# Patient Record
Sex: Female | Born: 1945 | Race: White | Hispanic: No | Marital: Married | Smoking: Former smoker
Health system: Southern US, Community
[De-identification: ages and names within clinical notes are randomized; demographics above are authoritative.]

## PROBLEM LIST (undated history)

## (undated) DIAGNOSIS — J45909 Unspecified asthma, uncomplicated: Secondary | ICD-10-CM

## (undated) DIAGNOSIS — I499 Cardiac arrhythmia, unspecified: Secondary | ICD-10-CM

## (undated) DIAGNOSIS — J189 Pneumonia, unspecified organism: Secondary | ICD-10-CM

## (undated) DIAGNOSIS — J439 Emphysema, unspecified: Secondary | ICD-10-CM

## (undated) HISTORY — PX: TUBAL LIGATION: SHX77

---

## 2004-01-22 ENCOUNTER — Emergency Department (HOSPITAL_COMMUNITY): Admission: EM | Admit: 2004-01-22 | Discharge: 2004-01-22 | Payer: Self-pay

## 2004-05-11 ENCOUNTER — Emergency Department (HOSPITAL_COMMUNITY): Admission: EM | Admit: 2004-05-11 | Discharge: 2004-05-12 | Payer: Self-pay | Admitting: Emergency Medicine

## 2004-05-12 ENCOUNTER — Emergency Department (HOSPITAL_COMMUNITY): Admission: EM | Admit: 2004-05-12 | Discharge: 2004-05-13 | Payer: Self-pay | Admitting: Emergency Medicine

## 2004-05-12 ENCOUNTER — Emergency Department (HOSPITAL_COMMUNITY): Admission: EM | Admit: 2004-05-12 | Discharge: 2004-05-12 | Payer: Self-pay | Admitting: Emergency Medicine

## 2004-06-21 ENCOUNTER — Emergency Department (HOSPITAL_COMMUNITY): Admission: EM | Admit: 2004-06-21 | Discharge: 2004-06-22 | Payer: Self-pay | Admitting: Emergency Medicine

## 2004-06-22 ENCOUNTER — Emergency Department (HOSPITAL_COMMUNITY): Admission: EM | Admit: 2004-06-22 | Discharge: 2004-06-23 | Payer: Self-pay | Admitting: Emergency Medicine

## 2004-08-13 ENCOUNTER — Emergency Department (HOSPITAL_COMMUNITY): Admission: EM | Admit: 2004-08-13 | Discharge: 2004-08-14 | Payer: Self-pay | Admitting: Emergency Medicine

## 2004-08-18 ENCOUNTER — Inpatient Hospital Stay (HOSPITAL_COMMUNITY): Admission: EM | Admit: 2004-08-18 | Discharge: 2004-08-21 | Payer: Self-pay

## 2004-11-07 ENCOUNTER — Emergency Department (HOSPITAL_COMMUNITY): Admission: EM | Admit: 2004-11-07 | Discharge: 2004-11-07 | Payer: Self-pay | Admitting: Emergency Medicine

## 2004-11-24 ENCOUNTER — Emergency Department (HOSPITAL_COMMUNITY): Admission: EM | Admit: 2004-11-24 | Discharge: 2004-11-24 | Payer: Self-pay | Admitting: Emergency Medicine

## 2004-12-04 ENCOUNTER — Emergency Department (HOSPITAL_COMMUNITY): Admission: EM | Admit: 2004-12-04 | Discharge: 2004-12-04 | Payer: Self-pay | Admitting: Emergency Medicine

## 2004-12-06 ENCOUNTER — Emergency Department: Payer: Self-pay | Admitting: Emergency Medicine

## 2005-01-05 ENCOUNTER — Emergency Department (HOSPITAL_COMMUNITY): Admission: EM | Admit: 2005-01-05 | Discharge: 2005-01-05 | Payer: Self-pay | Admitting: Emergency Medicine

## 2005-04-11 IMAGING — CR DG CHEST 2V
2 series · 2 of 2 positions shown · non-contrast
Comparison: none

CLINICAL DATA: 58-year-old, shortness of breath and chest pain.
 2-VIEW CHEST:
 Two views of the chest compared to prior films from 11/07/04.
 The cardiac silhouette, mediastinal and hilar contours are within normal limits and stable.  There are stable changes of COPD with biapical pleural and parenchymal scarring changes.  Bony structures are intact.

[w chest pa]
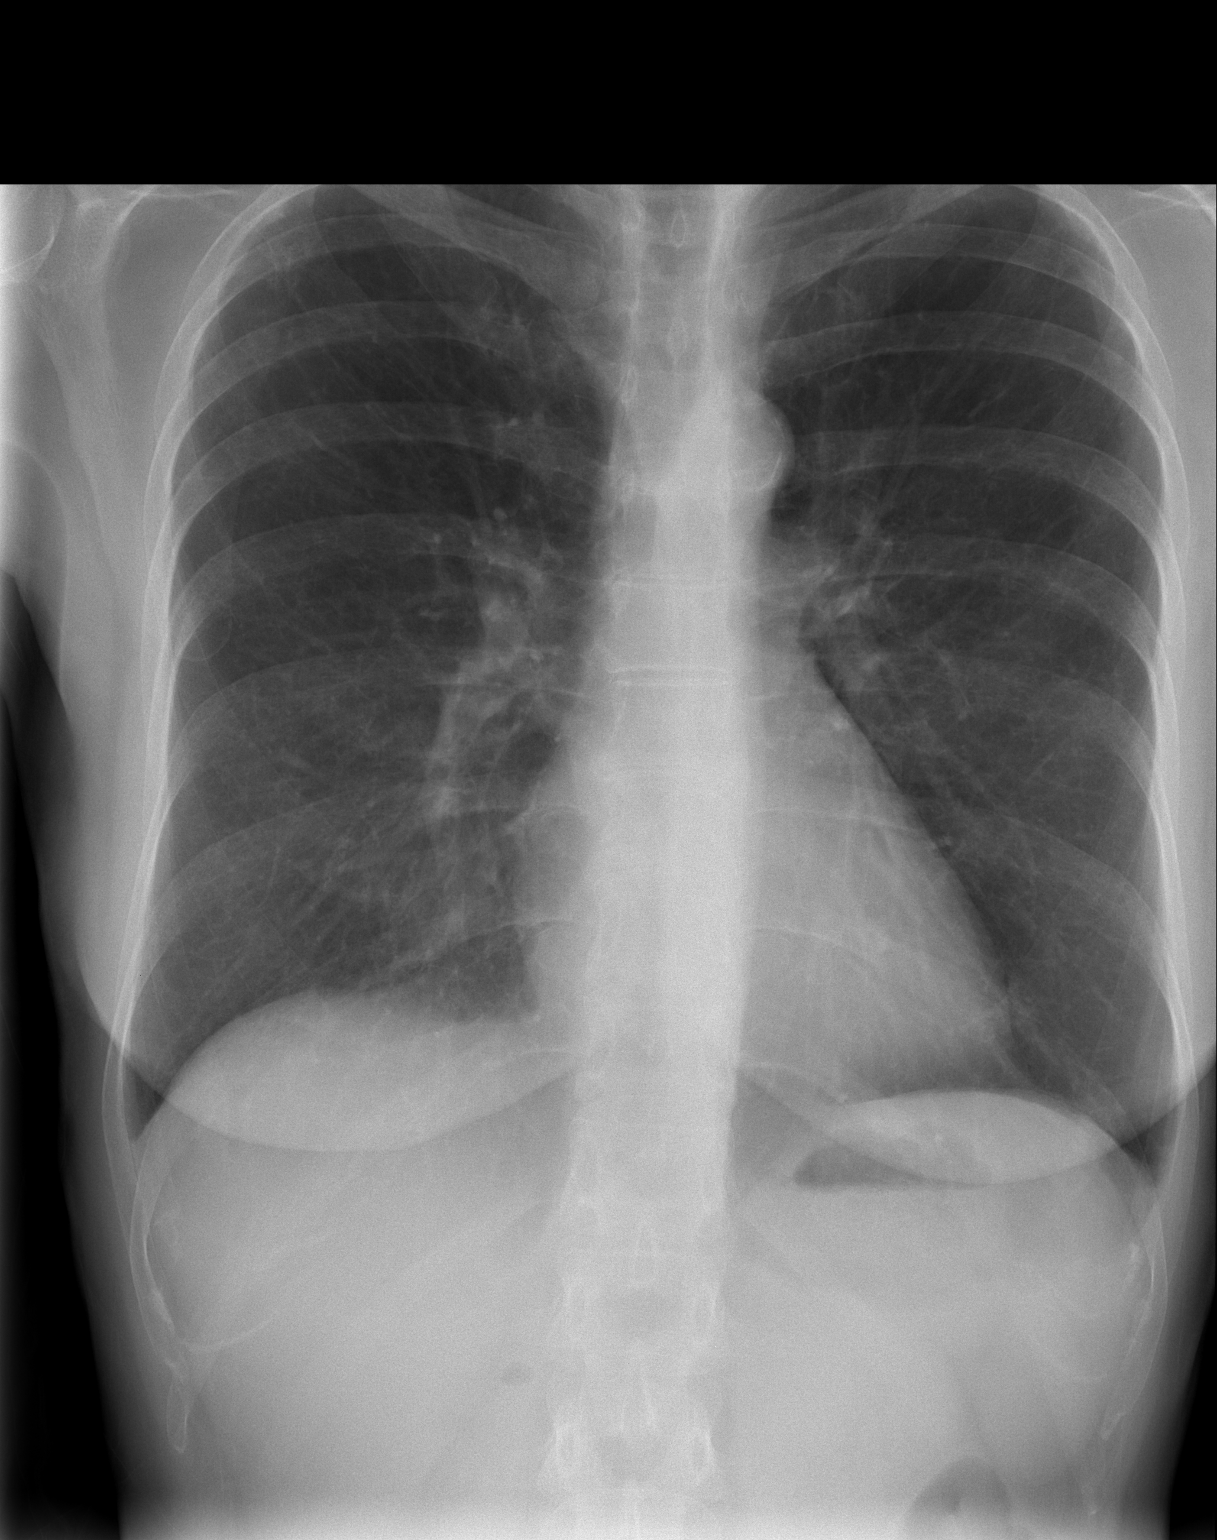

[w chest lat]
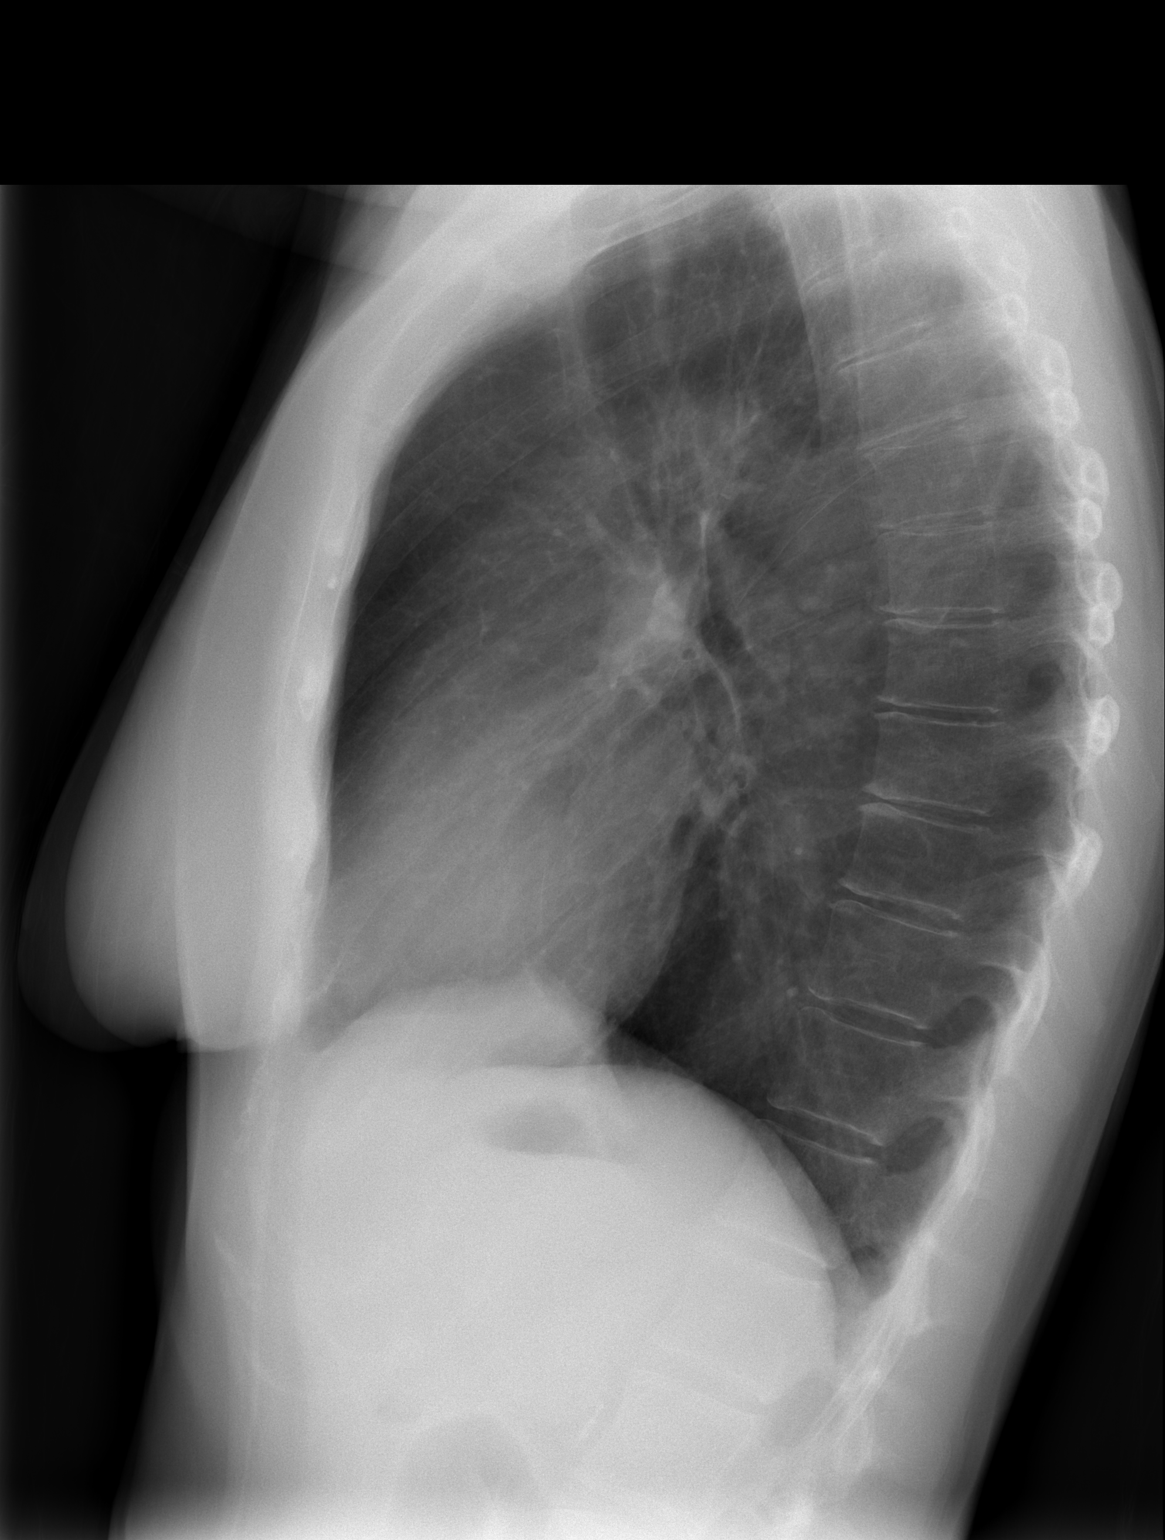

[2 of 2 positions shown; findings below may reference images not displayed]

IMPRESSION: No acute cardiopulmonary findings.  Stable changes of COPD.

## 2005-04-23 IMAGING — CR DG CHEST 2V
1 series · 2 of 2 positions shown · non-contrast
Comparison: none

REASON FOR EXAM: cough
COMMENTS:

[Series 1: view not recorded · 0.17mm/px · 2 of 2 slices shown]
[im 1/2]
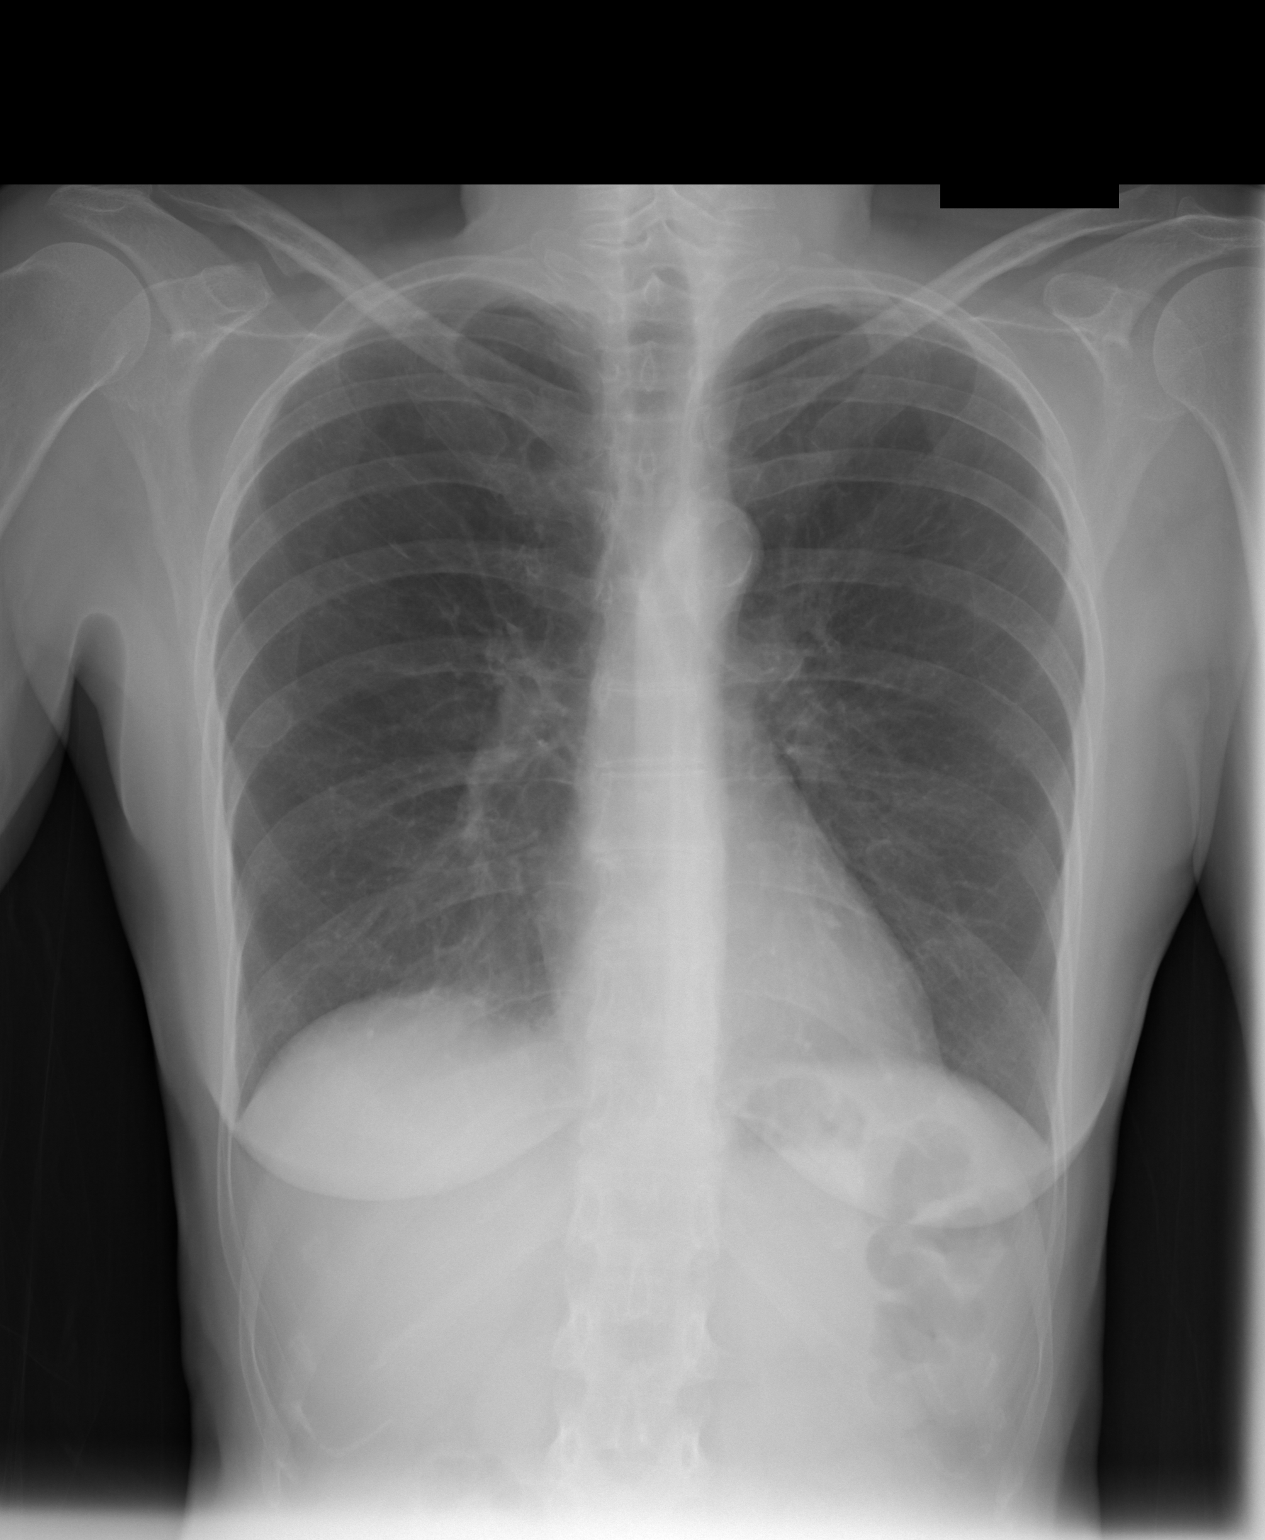
[im 2/2]
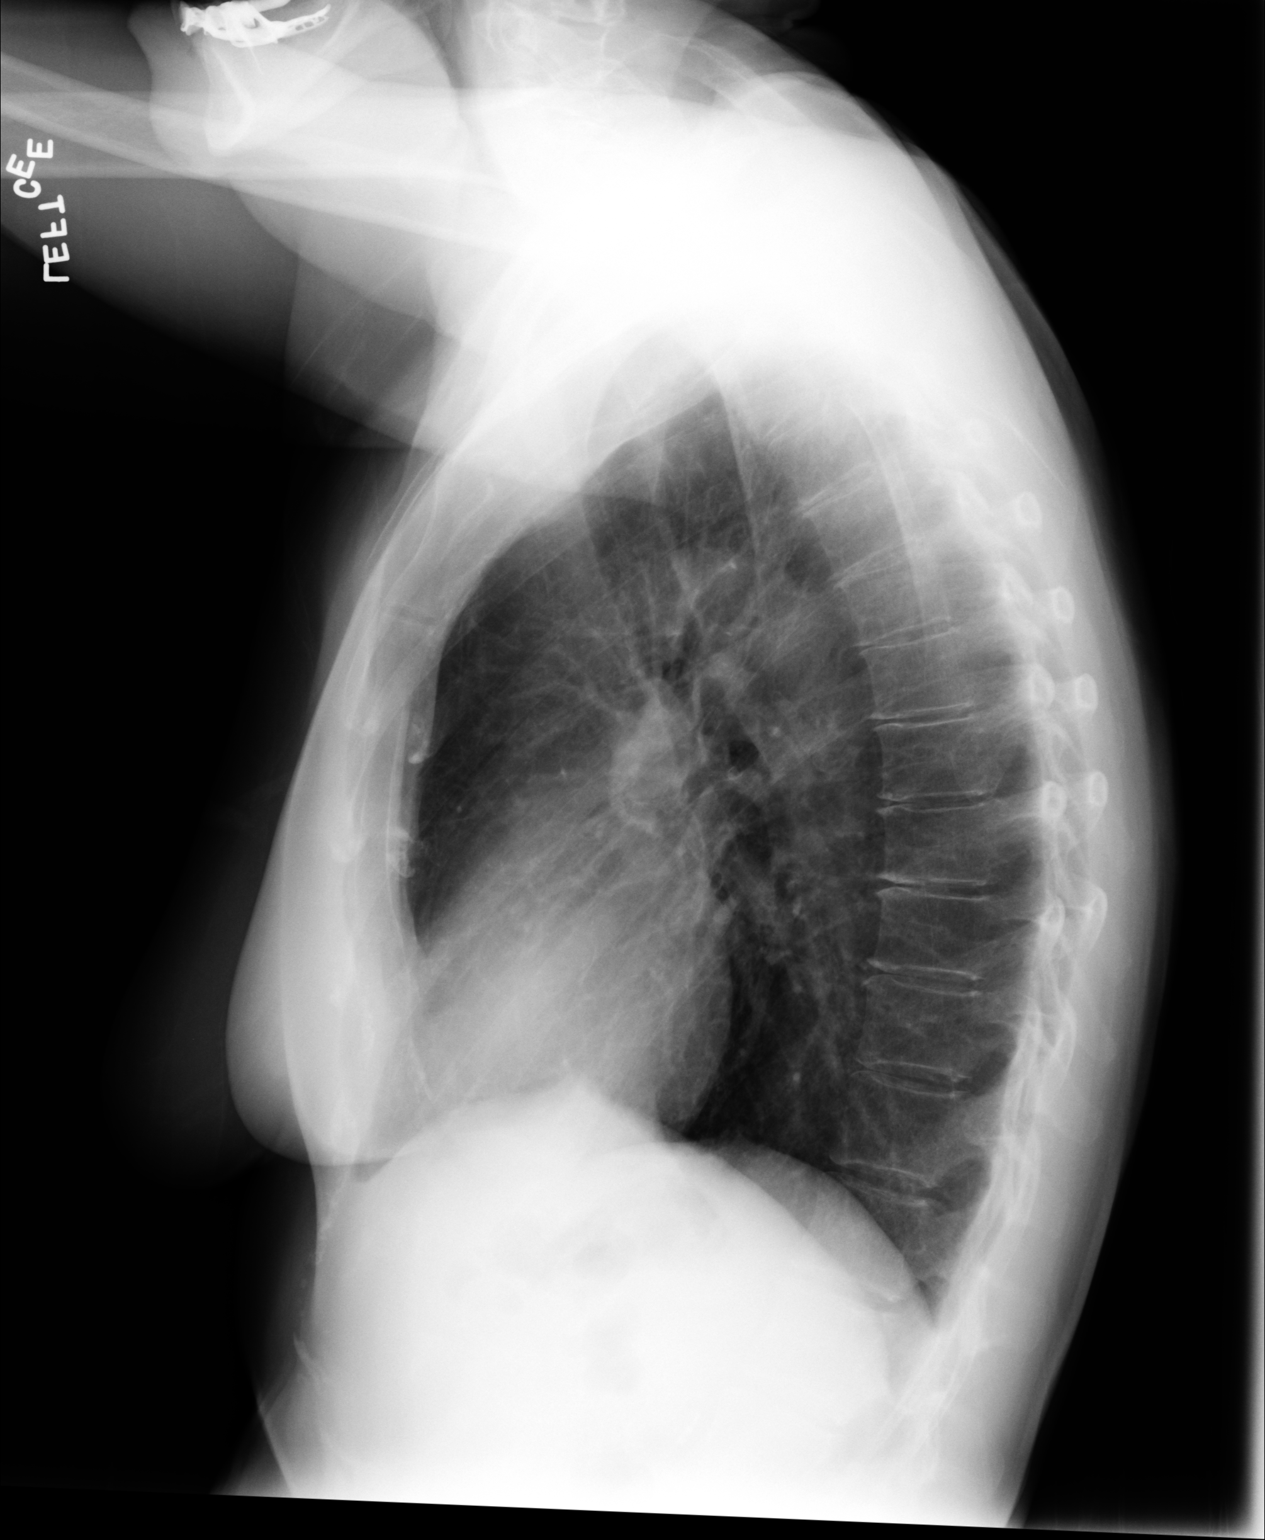

[2 of 2 positions shown; findings below may reference images not displayed]

PROCEDURE:     DXR - DXR CHEST PA (OR AP) AND LATERAL  - December 06, 2004  [DATE]

RESULT:        The lung fields are clear of infiltrate.  There is a linear
metallic density projected medially over the RIGHT lung field. This is not
seen in the lateral view and is apparently external.  No pneumonia,
pneumothorax or pleural effusion is seen.   The heart size is normal.   No
acute bony abnormalities are noted.
IMPRESSION: No acute changes are identified.

## 2017-09-13 ENCOUNTER — Encounter (HOSPITAL_COMMUNITY): Payer: Self-pay | Admitting: *Deleted

## 2017-09-13 ENCOUNTER — Emergency Department (HOSPITAL_COMMUNITY)
Admission: EM | Admit: 2017-09-13 | Discharge: 2017-09-13 | Disposition: A | Discharge status: Home / Self Care | Payer: Medicare Other | Attending: Emergency Medicine | Admitting: Emergency Medicine

## 2017-09-13 ENCOUNTER — Other Ambulatory Visit: Payer: Self-pay

## 2017-09-13 DIAGNOSIS — J449 Chronic obstructive pulmonary disease, unspecified: Secondary | ICD-10-CM | POA: Insufficient documentation

## 2017-09-13 DIAGNOSIS — Z87891 Personal history of nicotine dependence: Secondary | ICD-10-CM | POA: Insufficient documentation

## 2017-09-13 DIAGNOSIS — G894 Chronic pain syndrome: Secondary | ICD-10-CM | POA: Diagnosis present

## 2017-09-13 HISTORY — DX: Pneumonia, unspecified organism: J18.9

## 2017-09-13 HISTORY — DX: Cardiac arrhythmia, unspecified: I49.9

## 2017-09-13 HISTORY — DX: Emphysema, unspecified: J43.9

## 2017-09-13 HISTORY — DX: Unspecified asthma, uncomplicated: J45.909

## 2017-09-13 MED ORDER — HYDROCODONE-ACETAMINOPHEN 5-325 MG PO TABS
2.0000 | ORAL_TABLET | Freq: Once | ORAL | Status: AC
  Administered 2017-09-13: 2 via ORAL
  Filled 2017-09-13: qty 2

## 2017-09-13 NOTE — ED Provider Notes (Signed)
[facility] EMERGENCY DEPARTMENT Provider Note   CSN: [id number] Arrival date & time: 09/13/17  0117     History   Chief Complaint Chief Complaint  Patient presents with  . Medication Refill    HPI Sydney Armstrong is a 72 y.o. female.  The history is provided by the patient.  She has history of emphysema, pneumonia, and is treated for chronic pain.  She is visiting here from [place] and has all of her medication here except for her hydrocodone-acetaminophen.  She states that when she gets choked with phlegm, she takes her hydrocodone-acetaminophen and it helps her raise the sputum.  She states she has had 2 bouts of pneumonia this year.  She is complaining of generalized pain and thick sputum that gets caught in her throat and she does not have her hydrocodone-acetaminophen here with her.  She is asking for a refill on that prescription.  She denies fever or chills.  She denies vomiting or diarrhea.  Past Medical History:  Diagnosis Date  . Asthma   . Emphysema lung (HCC)   . Irregular heart beat   . Pneumonia     There are no active problems to display for this patient.   Past Surgical History:  Procedure Laterality Date  . TUBAL LIGATION      OB History    No data available       Home Medications    Prior to Admission medications   Not on File    Family History No family history on file.  Social History Social History   Tobacco Use  . Smoking status: Former Games developer  . Smokeless tobacco: Never Used  Substance Use Topics  . Alcohol use: No    Frequency: Never  . Drug use: No     Allergies   Atenolol; Clindamycin/lincomycin; Compazine [prochlorperazine edisylate]; Keflex [cephalexin]; Penicillins; Phenergan [promethazine hcl]; Prednisone; and Sulfa antibiotics   Review of Systems Review of Systems  All other systems reviewed and are negative.    Physical Exam Updated Vital Signs BP 136/87 (BP Location: Right Arm)    Pulse 80   Temp 98.4 F (36.9 C) (Oral)   Resp 15   SpO2 100%   Physical Exam  Nursing note and vitals reviewed.  72 year old female, resting comfortably and in no acute distress. Vital signs are normal. Oxygen saturation is 100%, which is normal. Head is normocephalic and atraumatic. PERRLA, EOMI. Oropharynx is clear. Neck is nontender and supple without adenopathy or JVD. Back is nontender and there is no CVA tenderness. Lungs are clear without rales, wheezes, or rhonchi. Chest is nontender. Heart has regular rate and rhythm without murmur. Abdomen is soft, flat, nontender without masses or hepatosplenomegaly and peristalsis is normoactive. Extremities have no cyanosis or edema, full range of motion is present. Skin is warm and dry without rash. Neurologic: Mental status is normal, cranial nerves are intact, there are no motor or sensory deficits.  ED Treatments / Results   Procedures Procedures (including critical care time)  Medications Ordered in ED Medications  HYDROcodone-acetaminophen (NORCO/VICODIN) 5-325 MG per tablet 2 tablet (not administered)     Initial Impression / Assessment and Plan / ED Course  I have reviewed the triage vital signs and the nursing notes.  Exacerbation of chronic pain syndrome.  COPD.  I have attempted to pull her records from the [place] controlled substance reporting website, but experiencing a technological error.  I have explained to the patient that if  she is in a pain management clinic, prescriptions filled from anyone other than that clinic may be grounds for her being kicked out of the clinic.  Therefore, I am giving her a single dose of hydrocodone-acetaminophen in the ED and have asked her to contact her pain clinic at home for appropriate instructions.  Final Clinical Impressions(s) / ED Diagnoses   Final diagnoses:  Chronic pain syndrome  Chronic obstructive pulmonary disease, unspecified COPD type [facility](HCC)    ED Discharge  Orders    None       Dione BoozeGlick, Tannie Koskela, MD 09/13/17 [phone]

## 2017-09-13 NOTE — ED Triage Notes (Signed)
Pt here from [place] visiting her son and ran out of her Norco. Now, requesting a refill of her pain medication, has taken tylenol x3 without relief.Pt is currently in a pain management for chronic neck pain and "my emphysema because it loosens up everything. Pt has a soft collar in place. Also reports several allergies, only able to name a few.

## 2017-09-13 NOTE — Discharge Instructions (Signed)
You need to get all of your narcotic prescriptions from one provider. If you get prescriptions from another provider, you may get kicked out of your pain clinic.
